# Patient Record
Sex: Female | Born: 1965 | Race: White | Hispanic: No | Marital: Married | Smoking: Never smoker
Health system: Southern US, Community
[De-identification: ages and names within clinical notes are randomized; demographics above are authoritative.]

## PROBLEM LIST (undated history)

## (undated) DIAGNOSIS — I10 Essential (primary) hypertension: Secondary | ICD-10-CM

## (undated) DIAGNOSIS — E119 Type 2 diabetes mellitus without complications: Secondary | ICD-10-CM

## (undated) HISTORY — PX: BREAST BIOPSY: SHX20

## (undated) HISTORY — DX: Essential (primary) hypertension: I10

## (undated) HISTORY — PX: CHOLECYSTECTOMY: SHX55

## (undated) HISTORY — DX: Type 2 diabetes mellitus without complications: E11.9

---

## 2013-04-12 ENCOUNTER — Ambulatory Visit: Payer: Self-pay | Admitting: Obstetrics and Gynecology

## 2013-04-19 ENCOUNTER — Ambulatory Visit: Payer: Self-pay | Admitting: Obstetrics and Gynecology

## 2014-02-08 ENCOUNTER — Ambulatory Visit: Payer: Self-pay | Admitting: Obstetrics and Gynecology

## 2014-02-08 DIAGNOSIS — I10 Essential (primary) hypertension: Secondary | ICD-10-CM

## 2014-02-08 LAB — CBC
HCT: 38.2 % (ref 35.0–47.0)
HGB: 12.8 g/dL (ref 12.0–16.0)
MCH: 29.3 pg (ref 26.0–34.0)
MCHC: 33.6 g/dL (ref 32.0–36.0)
MCV: 87 fL (ref 80–100)
Platelet: 379 10*3/uL (ref 150–440)
RBC: 4.37 10*6/uL (ref 3.80–5.20)
RDW: 13.8 % (ref 11.5–14.5)
WBC: 8.5 10*3/uL (ref 3.6–11.0)

## 2014-02-13 ENCOUNTER — Ambulatory Visit: Payer: Self-pay | Admitting: Obstetrics and Gynecology

## 2014-02-14 LAB — PATHOLOGY REPORT

## 2014-04-21 ENCOUNTER — Ambulatory Visit: Payer: Self-pay | Admitting: Obstetrics and Gynecology

## 2014-12-09 NOTE — Op Note (Signed)
PATIENT NAME:  Diana Moran, Diana Moran MR#:  [id number] DATE OF BIRTH:  1966/03/03  DATE OF PROCEDURE:  02/13/2014  PREOPERATIVE DIAGNOSIS: Menorrhagia.   POSTOPERATIVE DIAGNOSES:  1. Menorrhagia.  2. Uterine prolapse.  3. Traction cystocele.   OPERATIVE PROCEDURES:  1. Hysteroscopy with dilation and curettage of endometrium.  2. NovaSure endometrial ablation.   ANESTHESIA: General LMA.   SURGEON: Prentice DockerMartin A. Kamri Gotsch, MD   FIRST ASSISTANT: Jimmey RalphJade Morse, PA-S  INDICATIONS: The patient is a 49 year old married white female with a long history of menorrhagia refractory to conservative medical and surgical therapy. She is desiring definitive surgery through NovaSure endometrial ablation.   FINDINGS AT SURGERY: Revealed a gynecoid bony pelvis. There was uterine prolapse to within 2 cm of the introitus. There was a 2nd-degree traction cystocele present. The uterus sounded to 9 cm. Cervical length was 4 cm. The cavity width was 4 cm. Hysteroscopy post ablation demonstrated an excellent thermal effect in 90% of the uterine cavity with minimal effect at the uterine fundus toward the left cornea.   DESCRIPTION OF PROCEDURE: The patient was brought to the operating room where she was placed in the supine position. General anesthesia with LMA was induced without difficulty. She was placed in the dorsal lithotomy position using candy-cane stirrups. A Betadine perineal, intravaginal preparation Sterile drape was performed in standard fashion. A red Robinson catheter was used to drain 50 mL of urine from the bladder. A weighted speculum was placed into the vagina and a single-tooth tenaculum was placed on the anterior lip of the cervix. The uterus was sounded to 9 cm. The cervical canal was dilated with Hanks dilators up to a #8 JamaicaFrench. The [facility] hysteroscope using lactated Ringer's as irrigant was used to assess the intrauterine cavity. There was a proliferative-type endometrium present and the uterine ostia  bilaterally were visualized. Following curettage with both smooth and serrated curettes, the NovaSure instrument was inserted in standard fashion and deployed. Cavity test was performed and successful. The endometrial ablation was then performed in 69 seconds. Following completion of the procedure, the instrument was removed. A repeat hysteroscopy demonstrated an excellent thermal effect. The procedure was then terminated with all instrumentation being removed from the vagina. The patient was then awakened, mobilized, and taken to the recovery room in satisfactory condition.   ESTIMATED BLOOD LOSS: Minimal.   IV FLUIDS: 500 mL.   URINE OUTPUT: 50 mL.  COMPLICATIONS: None.   ADDENDUM NOTE: Please note that this patient is an excellent TVH candidate if hysterectomy is needed in the future.    ____________________________ Prentice DockerMartin A. Floetta Brickey, MD mad:dd D: 02/15/2014 16:44:16 ET Moran: 02/16/2014 04:58:30 ET JOB#: [id number]  cc: Daphine DeutscherMartin A. Daren Yeagle, MD, <Dictator> [facility] Prentice DockerMARTIN A Adanna Zuckerman MD ELECTRONICALLY SIGNED 02/18/2014 3:56

## 2017-01-27 DIAGNOSIS — E782 Mixed hyperlipidemia: Secondary | ICD-10-CM | POA: Insufficient documentation

## 2017-01-30 DIAGNOSIS — E119 Type 2 diabetes mellitus without complications: Secondary | ICD-10-CM | POA: Insufficient documentation

## 2017-02-16 ENCOUNTER — Encounter: Payer: Self-pay | Admitting: *Deleted

## 2017-02-16 ENCOUNTER — Encounter: Payer: Managed Care, Other (non HMO) | Attending: Internal Medicine | Admitting: *Deleted

## 2017-02-16 VITALS — BP 122/70 | Ht 64.0 in | Wt 214.3 lb

## 2017-02-16 DIAGNOSIS — E119 Type 2 diabetes mellitus without complications: Secondary | ICD-10-CM | POA: Insufficient documentation

## 2017-02-16 DIAGNOSIS — Z713 Dietary counseling and surveillance: Secondary | ICD-10-CM | POA: Diagnosis not present

## 2017-02-16 NOTE — Progress Notes (Signed)
Diabetes Self-Management Education  Visit Type: First/Initial  Appt. Start Time: 1355 Appt. End Time: 1500  02/16/2017  [name], identified by name and date of birth, is a 51 y.o. female with a diagnosis of Diabetes: Type 2.   ASSESSMENT  Blood pressure 122/70, height 5\' 4"  (1.626 m), weight 214 lb 4.8 oz (97.2 kg). Body mass index is 36.78 kg/m.      Diabetes Self-Management Education - 02/16/17 1508      Visit Information   Visit Type First/Initial     Initial Visit   Diabetes Type Type 2   Are you currently following a meal plan? Yes   What type of meal plan do you follow? weight watchers   Are you taking your medications as prescribed? Yes   Date Diagnosed June 2018     Health Coping   How would you rate your overall health? Good     Psychosocial Assessment   Patient Belief/Attitude about Diabetes Motivated to manage diabetes   Self-care barriers None   Self-management support Doctor's office   Patient Concerns Nutrition/Meal planning;Medication;Weight Control;Glycemic Control;Problem Solving;Healthy Lifestyle;Monitoring   Special Needs None   Preferred Learning Style Visual   Learning Readiness Change in progress   How often do you need to have someone help you when you read instructions, pamphlets, or other written materials from your doctor or pharmacy? 1 - Never   What is the last grade level you completed in school? 1 year college     Pre-Education Assessment   Patient understands the diabetes disease and treatment process. Needs Instruction   Patient understands incorporating nutritional management into lifestyle. Needs Instruction   Patient undertands incorporating physical activity into lifestyle. Needs Review   Patient understands using medications safely. Needs Instruction   Patient understands monitoring blood glucose, interpreting and using results Needs Review   Patient understands prevention, detection, and treatment of acute complications.  Needs Instruction   Patient understands prevention, detection, and treatment of chronic complications. Needs Instruction   Patient understands how to develop strategies to address psychosocial issues. Needs Instruction   Patient understands how to develop strategies to promote health/change behavior. Needs Instruction     Complications   How often do you check your blood sugar? 1-2 times/day   Fasting Blood glucose range (mg/dL) 409-811;914-782  FBG's 189-199 mg/dL with reading of 956 mg/dL today.   Postprandial Blood glucose range (mg/dL) 213-086  2 hrs after supper 190's mg/dL   Have you had a dilated eye exam in the past 12 months? No   Have you had a dental exam in the past 12 months? No   Are you checking your feet? Yes   How many days per week are you checking your feet? 7     Dietary Intake   Breakfast bacon or ham, egg, 1/2 cup grits   Lunch sandwich with 40 cal bread, salad, fruit   Snack (afternoon) fruit   Dinner salad, chicken, shrimp, black beans, fruit   Beverage(s) wtaer, unsweetened tea, 1 diet soda per day     Exercise   Exercise Type Light (walking / raking leaves)   How many days per week to you exercise? 4   How many minutes per day do you exercise? 30   Total minutes per week of exercise 120     Patient Education   Previous Diabetes Education No   Disease state  Definition of diabetes, type 1 and 2, and the diagnosis of diabetes;Explored patient's options for treatment  of their diabetes   Nutrition management  Role of diet in the treatment of diabetes and the relationship between the three main macronutrients and blood glucose level;Reviewed blood glucose goals for pre and post meals and how to evaluate the patients' food intake on their blood glucose level.;Meal timing in regards to the patients' current diabetes medication.   Physical activity and exercise  Role of exercise on diabetes management, blood pressure control and cardiac health.   Medications  Reviewed patients medication for diabetes, action, purpose, timing of dose and side effects.   Monitoring Purpose and frequency of SMBG.;Taught/discussed recording of test results and interpretation of SMBG.;Identified appropriate SMBG and/or A1C goals.   Chronic complications Relationship between chronic complications and blood glucose control   Psychosocial adjustment Identified and addressed patients feelings and concerns about diabetes     Individualized Goals (developed by patient)   Reducing Risk Improve blood sugars Decrease medications Prevent diabetes complications Lose weight Lead a healthier lifestyle Become more fit     Outcomes   Expected Outcomes Demonstrated interest in learning. Expect positive outcomes      Individualized Plan for Diabetes Self-Management Training:   Learning Objective:  Patient will have a greater understanding of diabetes self-management. Patient education plan is to attend individual and/or group sessions per assessed needs and concerns.   Plan:   Patient Instructions  Check blood sugars 2 x day before breakfast and 2 hrs after supper every day Bring blood sugar records to the next class Exercise: Continue walking  for 30-45  minutes 3-4  days a week Eat 3 meals day,1-2 snacks a day Space meals 4-6 hours apart Make an eye doctor appointment  Expected Outcomes:  Demonstrated interest in learning. Expect positive outcomes  Education material provided:  General Meal Planning Guidelines Simple Meal Plan  If problems or questions, patient to contact team via:  Sharion SettlerSheila Amalia Edgecombe, RN, CCM, CDE [phone]  Future DSME appointment:  Patient to check her work calendar and call back to schedule classes.

## 2017-02-16 NOTE — Patient Instructions (Signed)
Check blood sugars 2 x day before breakfast and 2 hrs after supper every day Bring blood sugar records to the next class  Exercise: Continue walking  for 30-45  minutes 3-4  days a week  Eat 3 meals day,1-2 snacks a day Space meals 4-6 hours apart  Make an eye doctor appointment  Return for classes on:

## 2017-03-17 ENCOUNTER — Encounter: Payer: Self-pay | Admitting: *Deleted

## 2018-03-25 ENCOUNTER — Ambulatory Visit: Payer: Self-pay | Admitting: Emergency Medicine

## 2018-03-25 VITALS — BP 134/80 | HR 81 | Temp 97.9°F | Resp 18

## 2018-03-25 DIAGNOSIS — W57XXXA Bitten or stung by nonvenomous insect and other nonvenomous arthropods, initial encounter: Secondary | ICD-10-CM

## 2018-03-25 DIAGNOSIS — L239 Allergic contact dermatitis, unspecified cause: Secondary | ICD-10-CM

## 2018-03-25 DIAGNOSIS — I10 Essential (primary) hypertension: Secondary | ICD-10-CM | POA: Insufficient documentation

## 2018-03-25 MED ORDER — BETAMETHASONE DIPROPIONATE 0.05 % EX CREA
TOPICAL_CREAM | CUTANEOUS | 0 refills | Status: DC
Start: 2018-03-25 — End: 2018-05-27

## 2018-03-25 MED ORDER — HYDROXYZINE HCL 25 MG PO TABS: ORAL_TABLET | ORAL | 0 refills | Status: DC

## 2018-03-25 NOTE — Progress Notes (Signed)
Subjective; She reports she stepped in an ants nest days 4 ago, since then progressively worsening itch and redness and bumps left foot.  Denies pain.  Denies any other rash . No red streaks. The rash is not progressing up her leg. Denies any generalized symptoms. No problems swallowing or breathing. No chest pain or shortness of breath or abdominal or GI symptoms. No fever or chills or nausea or vomiting. Tried topical Benadryl which helped a little. Tried oral Benadryl last night, and that was not that effective in helping the severe itch last night. Denies chance of pregnancy  Objective: Pleasant female, no distress.  Blood pressure 134/80 pulse 81, temp 97.9 respirations 18, oxygen saturation 100% on room air. HEENT: Normal.  No facial swelling.  Oropharynx clear Neck: Supple Heart: Regular rate and rhythm Lungs: Equal expansion Left foot: Multiple red papules.  No exudate or drainage or bleeding or fluctuance.  There is surrounding erythema, but no red streaks.  DP pulse and neurovascular distally intact. Skin: Above rash left foot.  No other rash noted.  Assessment: Localized allergic reaction from insect bites, left foot.  No evidence of systemic allergic reaction.  No evidence of localized infection  Plans: Treatment options discussed, as well as risks, benefits, alternatives. Patient voiced understanding and agreement with the following plans: Hydroxyzine 25 mg, 1 or 2 at bedtime as needed severe itch. Betamethasone dipropionate cream, apply left foot twice daily. Other symptomatic care discussed. Follow-up with your primary care doctor in 5-7 days if not improving, or sooner if symptoms become worse. Precautions discussed. Red flags discussed. Questions invited and answered.-Patient was offered AVS, but she declined. Patient voiced understanding and agreement.

## 2018-05-27 ENCOUNTER — Ambulatory Visit: Payer: Self-pay | Admitting: Emergency Medicine

## 2018-05-27 VITALS — BP 118/85 | HR 71 | Temp 98.2°F | Resp 14 | Ht 64.5 in | Wt 210.0 lb

## 2018-05-27 DIAGNOSIS — R05 Cough: Secondary | ICD-10-CM

## 2018-05-27 DIAGNOSIS — J0100 Acute maxillary sinusitis, unspecified: Secondary | ICD-10-CM

## 2018-05-27 DIAGNOSIS — R059 Cough, unspecified: Secondary | ICD-10-CM

## 2018-05-27 MED ORDER — BENZONATATE 100 MG PO CAPS: 100.0000 mg | ORAL_CAPSULE | Freq: Three times a day (TID) | ORAL | 0 refills | Status: DC | PRN

## 2018-05-27 MED ORDER — AMOXICILLIN 875 MG PO TABS: 875.0000 mg | ORAL_TABLET | Freq: Two times a day (BID) | ORAL | 0 refills | Status: DC

## 2018-05-27 MED ORDER — FLUCONAZOLE 150 MG PO TABS: 150.0000 mg | ORAL_TABLET | Freq: Once | ORAL | 0 refills | Status: AC

## 2018-05-27 NOTE — Progress Notes (Signed)
Subjective. Patient had onset of symptoms 1 week ago.  She started with a sore throat and this was followed by development of laryngitis which lasted approximately 48 hours.  She has had head and chest congestion but primarily nasal congestion.  She occasionally has a productive cough and intermittently has greenish thick discharge from her nose.  Of note patient is diabetic. Objective. Alert and cooperative not in any distress. TMs clear. Throat normal. Chest clear to auscultation percussion. Puffiness noted over both maxillary sinuses with minimal tenderness. Assessment. Acute maxillary sinusitis Cough.. Plan. Amoxicillin 875 twice daily. Tessalon Perles. Diflucan as prophylaxis. Patient advised not to take simvastatin while taking the Diflucan.  If she takes a Diflucan she will hold the simvastatin for 3 days

## 2018-05-27 NOTE — Patient Instructions (Addendum)
Take amoxicillin twice a day. Use Tessalon Perles as needed. If you take the Diflucan do not take your simvastatin for the following 3 days. Return to clinic if worsening symptoms. Sinusitis, Adult Sinusitis is soreness and inflammation of your sinuses. Sinuses are hollow spaces in the bones around your face. They are located:  Around your eyes.  In the middle of your forehead.  Behind your nose.  In your cheekbones.  Your sinuses and nasal passages are lined with a stringy fluid (mucus). Mucus normally drains out of your sinuses. When your nasal tissues get inflamed or swollen, the mucus can get trapped or blocked so air cannot flow through your sinuses. This lets bacteria, viruses, and funguses grow, and that leads to infection. Follow these instructions at home: Medicines  Take, use, or apply over-the-counter and prescription medicines only as told by your doctor. These may include nasal sprays.  If you were prescribed an antibiotic medicine, take it as told by your doctor. Do not stop taking the antibiotic even if you start to feel better. Hydrate and Humidify  Drink enough water to keep your pee (urine) clear or pale yellow.  Use a cool mist humidifier to keep the humidity level in your home above 50%.  Breathe in steam for 10-15 minutes, 3-4 times a day or as told by your doctor. You can do this in the bathroom while a hot shower is running.  Try not to spend time in cool or dry air. Rest  Rest as much as possible.  Sleep with your head raised (elevated).  Make sure to get enough sleep each night. General instructions  Put a warm, moist washcloth on your face 3-4 times a day or as told by your doctor. This will help with discomfort.  Wash your hands often with soap and water. If there is no soap and water, use hand sanitizer.  Do not smoke. Avoid being around people who are smoking (secondhand smoke).  Keep all follow-up visits as told by your doctor. This is  important. Contact a doctor if:  You have a fever.  Your symptoms get worse.  Your symptoms do not get better within 10 days. Get help right away if:  You have a very bad headache.  You cannot stop throwing up (vomiting).  You have pain or swelling around your face or eyes.  You have trouble seeing.  You feel confused.  Your neck is stiff.  You have trouble breathing. This information is not intended to replace advice given to you by your health care provider. Make sure you discuss any questions you have with your health care provider. Document Released: 01/21/2008 Document Revised: 03/30/2016 Document Reviewed: 05/30/2015 Elsevier Interactive Patient Education  [employer].

## 2020-01-30 ENCOUNTER — Telehealth: Payer: Managed Care, Other (non HMO) | Admitting: Nurse Practitioner

## 2020-01-30 ENCOUNTER — Other Ambulatory Visit: Payer: Self-pay

## 2020-01-30 ENCOUNTER — Encounter: Payer: Self-pay | Admitting: Nurse Practitioner

## 2020-01-30 DIAGNOSIS — J069 Acute upper respiratory infection, unspecified: Secondary | ICD-10-CM

## 2020-01-30 NOTE — Patient Instructions (Addendum)
Diana Moran continue to hydrate yourself Consider hot tea, with honey and lemon for your cough Continue the Loratidine and cough syrup as directed Please start Fluticasone (Flonase) and take 1 spray to each nostril twice daily until symptoms resolve If your symptoms are worsening or are not improving by 02/03/2020 please call the office and have them send me a message via in basket and I will send you in an antibiotic. At this time, this is viral and hopefull will resolve with supporative therapy. Patient verbalized understanding of all instructions given/reviewed and has no further questions or concerns at this time.

## 2020-01-30 NOTE — Progress Notes (Signed)
   Subjective:    Patient ID: Diana Moran, female    DOB: 05/11/1966, 54 y.o.   MRN: [id number]  HPI Diana Moran is on a virtual visit today with permission to treat. She reports sinus pressure and cough x 4 days. She reports a scratchy throat but denies a sore throat. She endorses some sneezing and runny nose; which is clear in nature. She also has periods of nasal congestion. She started Loratadine yesterday and has taken 4 doses of Robitussin DM with some relief. She denies fever, fatigue, wheezing, SOB, facial pain and ear pain.     Review of Systems  Constitutional: Negative for fatigue and fever.  HENT: Positive for congestion, postnasal drip and sneezing. Negative for ear pain, sinus pain and sore throat.   Respiratory: Positive for cough.        Objective:   Physical Exam Constitutional:      General: She is not in acute distress.    Appearance: Normal appearance. She is not ill-appearing, toxic-appearing or diaphoretic.  HENT:     Head: Normocephalic and atraumatic.  Pulmonary:     Effort: Pulmonary effort is normal.  Neurological:     Mental Status: She is alert and oriented to person, place, and time.  Psychiatric:        Mood and Affect: Mood normal.        Behavior: Behavior normal.           Assessment & Plan:

## 2020-02-02 ENCOUNTER — Other Ambulatory Visit: Payer: Self-pay

## 2020-02-02 ENCOUNTER — Encounter: Payer: Self-pay | Admitting: Medical

## 2020-02-02 ENCOUNTER — Ambulatory Visit: Payer: Managed Care, Other (non HMO) | Admitting: Medical

## 2020-02-02 DIAGNOSIS — R05 Cough: Secondary | ICD-10-CM | POA: Diagnosis not present

## 2020-02-02 DIAGNOSIS — J069 Acute upper respiratory infection, unspecified: Secondary | ICD-10-CM

## 2020-02-02 DIAGNOSIS — R059 Cough, unspecified: Secondary | ICD-10-CM

## 2020-02-02 MED ORDER — BENZONATATE 100 MG PO CAPS: ORAL_CAPSULE | ORAL | 0 refills | Status: DC

## 2020-02-02 NOTE — Patient Instructions (Signed)
Upper Respiratory Infection, Adult An upper respiratory infection (URI) affects the nose, throat, and upper air passages. URIs are caused by germs (viruses). The most common type of URI is often called "the common cold." Medicines cannot cure URIs, but you can do things at home to relieve your symptoms. URIs usually get better within 7-10 days. Follow these instructions at home: Activity  Rest as needed.  If you have a fever, stay home from work or school until your fever is gone, or until your doctor says you may return to work or school. ? You should stay home until you cannot spread the infection anymore (you are not contagious). ? Your doctor may have you wear a face mask so you have less risk of spreading the infection. Relieving symptoms  Gargle with a salt-water mixture 3-4 times a day or as needed. To make a salt-water mixture, completely dissolve -1 tsp of salt in 1 cup of warm water.  Use a cool-mist humidifier to add moisture to the air. This can help you breathe more easily. Eating and drinking   Drink enough fluid to keep your pee (urine) pale yellow.  Eat soups and other clear broths. General instructions   Take over-the-counter and prescription medicines only as told by your doctor. These include cold medicines, fever reducers, and cough suppressants.  Do not use any products that contain nicotine or tobacco. These include cigarettes and e-cigarettes. If you need help quitting, ask your doctor.  Avoid being where people are smoking (avoid secondhand smoke).  Make sure you get regular shots and get the flu shot every year.  Keep all follow-up visits as told by your doctor. This is important. How to avoid spreading infection to others   Wash your hands often with soap and water. If you do not have soap and water, use hand sanitizer.  Avoid touching your mouth, face, eyes, or nose.  Cough or sneeze into a tissue or your sleeve or elbow. Do not cough or sneeze  into your hand or into the air. Contact a doctor if:  You are getting worse, not better.  You have any of these: ? A fever. ? Chills. ? Brown or red mucus in your nose. ? Yellow or brown fluid (discharge)coming from your nose. ? Pain in your face, especially when you bend forward. ? Swollen neck glands. ? Pain with swallowing. ? White areas in the back of your throat. Get help right away if:  You have shortness of breath that gets worse.  You have very bad or constant: ? Headache. ? Ear pain. ? Pain in your forehead, behind your eyes, and over your cheekbones (sinus pain). ? Chest pain.  You have long-lasting (chronic) lung disease along with any of these: ? Wheezing. ? Long-lasting cough. ? Coughing up blood. ? A change in your usual mucus.  You have a stiff neck.  You have changes in your: ? Vision. ? Hearing. ? Thinking. ? Mood. Summary  An upper respiratory infection (URI) is caused by a germ called a virus. The most common type of URI is often called "the common cold."  URIs usually get better within 7-10 days.  Take over-the-counter and prescription medicines only as told by your doctor. This information is not intended to replace advice given to you by your health care provider. Make sure you discuss any questions you have with your health care provider. Document Revised: 08/12/2018 Document Reviewed: 03/27/2017 Elsevier Patient Education  2020 [employer] Cough, Adult A cough  helps to clear your throat and lungs. A cough may be a sign of an illness or another medical condition. An acute cough may only last 2-3 weeks, while a chronic cough may last 8 or more weeks. Many things can cause a cough. They include:  Germs (viruses or bacteria) that attack the airway.  Breathing in things that bother (irritate) your lungs.  Allergies.  Asthma.  Mucus that runs down the back of your throat (postnasal drip).  Smoking.  Acid backing up from the stomach  into the tube that moves food from the mouth to the stomach (gastroesophageal reflux).  Some medicines.  Lung problems.  Other medical conditions, such as heart failure or a blood clot in the lung (pulmonary embolism). Follow these instructions at home: Medicines  Take over-the-counter and prescription medicines only as told by your doctor.  Talk with your doctor before you take medicines that stop a cough (coughsuppressants). Lifestyle   Do not smoke, and try not to be around smoke. Do not use any products that contain nicotine or tobacco, such as cigarettes, e-cigarettes, and chewing tobacco. If you need help quitting, ask your doctor.  Drink enough fluid to keep your pee (urine) pale yellow.  Avoid caffeine.  Do not drink alcohol if your doctor tells you not to drink. General instructions   Watch for any changes in your cough. Tell your doctor about them.  Always cover your mouth when you cough.  Stay away from things that make you cough, such as perfume, candles, campfire smoke, or cleaning products.  If the air is dry, use a cool mist vaporizer or humidifier in your home.  If your cough is worse at night, try using extra pillows to raise your head up higher while you sleep.  Rest as needed.  Keep all follow-up visits as told by your doctor. This is important. Contact a doctor if:  You have new symptoms.  You cough up pus.  Your cough does not get better after 2-3 weeks, or your cough gets worse.  Cough medicine does not help your cough and you are not sleeping well.  You have pain that gets worse or pain that is not helped with medicine.  You have a fever.  You are losing weight and you do not know why.  You have night sweats. Get help right away if:  You cough up blood.  You have trouble breathing.  Your heartbeat is very fast. These symptoms may be an emergency. Do not wait to see if the symptoms will go away. Get medical help right away. Call  your local emergency services (911 in the [place] Do not drive yourself to the hospital. Summary  A cough helps to clear your throat and lungs. Many things can cause a cough.  Take over-the-counter and prescription medicines only as told by your doctor.  Always cover your mouth when you cough.  Contact a doctor if you have new symptoms or you have a cough that does not get better or gets worse. This information is not intended to replace advice given to you by your health care provider. Make sure you discuss any questions you have with your health care provider. Document Revised: 08/23/2018 Document Reviewed: 08/23/2018 Elsevier Patient Education  2020 [employer] Inc[employer]  

## 2020-02-02 NOTE — Progress Notes (Signed)
° °  Subjective:    Patient ID: Diana Moran, female    DOB: May 27, 1966, 54 y.o.   MRN: [id number]  HPI  This a telemedicine appointment for complaint of cough worse at night since her visit on 01/30/2020.Reviewed note from 01/30/2020. Nurse unable to add it as a telephone virtual appointment on clinics schedule.  Contacted original provider as guided by providers note. Response back was for me to access and take care of the patient.  Called patient at  2:40pm, no answer. Left message with return phone call number.   Called at  3:16pm left message and number for return call.  Patient returned my call at  3:48 pm. 54 yo female who does not sound to be in any distress. She states she had trouble sleeping due to cough last night, she did try some Robitussin -DM but it did not help. Cough is better during the day.  It is productive slightly and clear. She denies any fever or chills, fatigue, SOB, wheezing or CP, or abdominal pain. She has a history of diarrhea with taking Metformin, so no new changes.. Nasal congestion is improve to where she can now breath in and out of her nose.  She has tried Social worker. Taking Claritin and Flonase as instructed.  She has not had Covid-19 but has had the Covid-19 vaccine with the second vaccine on Feb. 2nd 2021.     Review of Systems  Constitutional: Negative for chills, fatigue and fever.  HENT: Positive for congestion (much better today).   Respiratory: Positive for cough (slight productive cough- clear). Negative for shortness of breath and wheezing.   Cardiovascular: Negative for chest pain.  Gastrointestinal: Positive for diarrhea (her baseline is with diarrhea due to her Metformin.). Negative for abdominal pain, nausea and vomiting.  Musculoskeletal: Negative for arthralgias and myalgias.   Allergy to meds: none  Medications currently taking: Metformin XR\Trulicity .75mg  Lisnopril-HCTZ20/12.5mg     Objective: This was a telephone appointment    Physical Exam Pulmonary:     Breath sounds: No wheezing (none heard over phone).  Neurological:     Mental Status: She is alert and oriented to person, place, and time.  Psychiatric:        Mood and Affect: Mood normal.        Thought Content: Thought content normal.        Judgment: Judgment normal.   No cough heard during the phone call. Able to speak in full sentences.        Assessment & Plan:  URI- viral with at night cough.Nasal Congestion improving. History of Diabletes. Will try Benzonatate 100 mg, may take two if needed.  Sent to patient pharmacy. Meds ordered this encounter  Medications   benzonatate (TESSALON) 100 MG capsule    Sig: Take one -two capsules by mouth every 8 hours as needed for cough.    Dispense:  20 capsule    Refill:  0    If medication is not  helping she may call tomorrow for reevaluation. Patient verbalizes understanding and has no questions at the end of our conversation. May consider taking Benadryl 25mg  at bedtime to help with possible PND. May consider Covid-19 testing if she is unable to sleep tonight or if worsening. May also consider adding an antibiotic to cover for secondary infection like Z-Pak.. As of 02/03/2020 she will have been sick x 8 days.

## 2020-06-07 ENCOUNTER — Other Ambulatory Visit: Payer: Self-pay

## 2020-06-07 ENCOUNTER — Encounter: Payer: Self-pay | Admitting: *Deleted

## 2020-06-07 ENCOUNTER — Encounter: Payer: Managed Care, Other (non HMO) | Attending: Internal Medicine | Admitting: *Deleted

## 2020-06-07 VITALS — BP 110/76 | Ht 64.5 in | Wt 220.0 lb

## 2020-06-07 DIAGNOSIS — E1165 Type 2 diabetes mellitus with hyperglycemia: Secondary | ICD-10-CM

## 2020-06-07 DIAGNOSIS — E119 Type 2 diabetes mellitus without complications: Secondary | ICD-10-CM | POA: Insufficient documentation

## 2020-06-07 NOTE — Progress Notes (Signed)
Diabetes Self-Management Education  Visit Type: First/Initial  Appt. Start Time: 1535 Appt. End Time: 1645  06/07/2020  [name], identified by name and date of birth, is a 54 y.o. female with a diagnosis of Diabetes: Type 2.   ASSESSMENT  Blood pressure 110/76, height 5' 4.5" (1.638 m), weight 220 lb (99.8 kg). Body mass index is 37.18 kg/m.   Diabetes Self-Management Education - 06/07/20 1653      Visit Information   Visit Type First/Initial      Initial Visit   Diabetes Type Type 2    Are you currently following a meal plan? No    Are you taking your medications as prescribed? Yes    Date Diagnosed 3 years ago      Health Coping   How would you rate your overall health? Good      Psychosocial Assessment   Patient Belief/Attitude about Diabetes Motivated to manage diabetes   "stress about numbers and frustrated with diet"   Self-care barriers None    Self-management support Doctor's office;Friends;Family    Patient Concerns Nutrition/Meal planning;Medication;Monitoring;Glycemic Control;Weight Control;Healthy Lifestyle    Special Needs None    Preferred Learning Style Auditory;Visual;Hands on    Learning Readiness Change in progress    How often do you need to have someone help you when you read instructions, pamphlets, or other written materials from your doctor or pharmacy? 1 - Never    What is the last grade level you completed in school? 12th grade      Pre-Education Assessment   Patient understands the diabetes disease and treatment process. Needs Review    Patient understands incorporating nutritional management into lifestyle. Needs Review    Patient undertands incorporating physical activity into lifestyle. Needs Review    Patient understands using medications safely. Needs Instruction    Patient understands monitoring blood glucose, interpreting and using results Needs Review    Patient understands prevention, detection, and treatment of acute  complications. Needs Instruction    Patient understands prevention, detection, and treatment of chronic complications. Needs Review    Patient understands how to develop strategies to address psychosocial issues. Needs Instruction    Patient understands how to develop strategies to promote health/change behavior. Needs Instruction      Complications   Last HgB A1C per patient/outside source 7.9 %   05/04/2020   How often do you check your blood sugar? 1-2 times/day    Fasting Blood glucose range (mg/dL) 841-660;>630   Pt reports FBG's 180-220 mg/dL.   Have you had a dilated eye exam in the past 12 months? No    Have you had a dental exam in the past 12 months? Yes    Are you checking your feet? Yes    How many days per week are you checking your feet? 7      Dietary Intake   Breakfast 1-2 eggs, gluten free grits; eggs and bacon; smoothie with almond milk, [place] yogurt, frozen fruit - strawberries, peaches, pineapple    Snack (morning) 0-2 snacks/day wheat thins, smart popcorn    Lunch salad (lettuce, cuccumbers, onions, cheese with ranch dressiong and sometimes chicken, veggie plate - potaotes, lima beans, peas    Dinner chicken, pork, fried fish weekly with slaw, occasional beef; potaotes, beans, peas, corn, whole wheat pasta, broccoli, green beans, carrots, squash, zucchini    Snack (evening) sometimes carb free ice cream    Beverage(s) water, unsweetened tea, coffee, diet soda      Exercise  Exercise Type Light (walking / raking leaves)    How many days per week to you exercise? 4    How many minutes per day do you exercise? 40    Total minutes per week of exercise 160      Patient Education   Previous Diabetes Education Yes (please comment)   2018 - she came for 1 initial visit   Disease state  Definition of diabetes, type 1 and 2, and the diagnosis of diabetes;Factors that contribute to the development of diabetes    Nutrition management  Role of diet in the treatment of diabetes  and the relationship between the three main macronutrients and blood glucose level;Food label reading, portion sizes and measuring food.;Carbohydrate counting;Reviewed blood glucose goals for pre and post meals and how to evaluate the patients' food intake on their blood glucose level.    Physical activity and exercise  Role of exercise on diabetes management, blood pressure control and cardiac health.    Medications Reviewed patients medication for diabetes, action, purpose, timing of dose and side effects.    Monitoring Purpose and frequency of SMBG.;Taught/discussed recording of test results and interpretation of SMBG.;Identified appropriate SMBG and/or A1C goals.    Chronic complications Relationship between chronic complications and blood glucose control    Psychosocial adjustment Role of stress on diabetes;Identified and addressed patients feelings and concerns about diabetes      Individualized Goals (developed by patient)   Reducing Risk Other (comment)   improve blood sugars, decrease medications, prevent diabetes complications, lose weight, lead a healthier llifestyle, become more fit     Outcomes   Expected Outcomes Demonstrated interest in learning. Expect positive outcomes    Future DMSE 2 wks           Individualized Plan for Diabetes Self-Management Training:   Learning Objective:  Patient will have a greater understanding of diabetes self-management. Patient education plan is to attend individual and/or group sessions per assessed needs and concerns.   Plan:   Patient Instructions  Check blood sugars 1 x day before breakfast or  2 hrs after one meal every day Bring blood sugar records to the next class Exercise: Continue walking for 30-45 minutes 3-4 days a week Eat 3 meals day,1-2 snacks a day Space meals 4-6 hours apart Limit fried foods Make an eye doctor appointment  Expected Outcomes:  Demonstrated interest in learning. Expect positive outcomes  Education  material provided:  General Meal Planning Guidelines Simple Meal Plan  If problems or questions, patient to contact team via:  Sharion Settler, RN, CCM, CDCES [phone]  Future DSME appointment: 2 wks  June 21, 2020 for Diabetes Class 1

## 2020-06-07 NOTE — Patient Instructions (Addendum)
Check blood sugars 1 x day before breakfast or  2 hrs after one meal every day Bring blood sugar records to the next class  Exercise: Continue walking for 30-45 minutes 3-4 days a week  Eat 3 meals day,1-2 snacks a day Space meals 4-6 hours apart Limit fried foods  Make an eye doctor appointment  Return for classes on:

## 2020-06-21 ENCOUNTER — Ambulatory Visit: Payer: Managed Care, Other (non HMO)

## 2020-06-28 ENCOUNTER — Ambulatory Visit: Payer: Managed Care, Other (non HMO)

## 2020-07-03 ENCOUNTER — Encounter: Payer: Self-pay | Admitting: *Deleted

## 2020-07-16 ENCOUNTER — Other Ambulatory Visit: Payer: Self-pay

## 2020-07-16 ENCOUNTER — Telehealth: Payer: Managed Care, Other (non HMO) | Admitting: Nurse Practitioner

## 2020-07-16 DIAGNOSIS — R0981 Nasal congestion: Secondary | ICD-10-CM

## 2020-07-16 DIAGNOSIS — H9201 Otalgia, right ear: Secondary | ICD-10-CM

## 2020-07-16 MED ORDER — BENZONATATE 200 MG PO CAPS: 200.0000 mg | ORAL_CAPSULE | Freq: Two times a day (BID) | ORAL | 0 refills | Status: AC | PRN

## 2020-07-16 MED ORDER — AMOXICILLIN 500 MG PO CAPS: 500.0000 mg | ORAL_CAPSULE | Freq: Three times a day (TID) | ORAL | 0 refills | Status: AC

## 2020-07-16 NOTE — Progress Notes (Signed)
   Subjective:    Patient ID: Diana Moran, female    DOB: [birth year], 54 y.o.   MRN: [id number]  HPI  I connected with  Diana Moran on 07/16/20 by a video enabled telemedicine application and verified that I am speaking with the correct person using two identifiers.   I discussed the limitations of evaluation and management by telemedicine. The patient expressed understanding and agreed to proceed.  Diana Moran is a 54 y.o. female with c/o sinus congestion and right ear pain. The symptoms started 5 days ago and include pressure around the right eye that radiates to the right ear and right upper teeth. Home tx includes Sinus max mucinex and use of humidifier. Patient reports coughing due to post nasal drainage.   Past Medical History:  Diagnosis Date  . Diabetes mellitus without complication (HCC)   . Hypertension    Past Surgical History:  Procedure Laterality Date  . CHOLECYSTECTOMY     Current Outpatient Medications on File Prior to Visit  Medication Sig Dispense Refill  . glucose blood (CONTOUR NEXT TEST) test strip USE DAILY TO CHECK BLOOD SUGAR    . lisinopril-hydrochlorothiazide (ZESTORETIC) 20-12.5 MG tablet Take 1 tablet by mouth daily.    . metFORMIN (GLUCOPHAGE-XR) 500 MG 24 hr tablet Take 500 mg by mouth daily with breakfast.    . Multiple Vitamin (MULTIVITAMIN) tablet Take 1 tablet by mouth daily.    . rosuvastatin (CRESTOR) 10 MG tablet Take 10 mg by mouth at bedtime.     No current facility-administered medications on file prior to visit.   Allergies  Allergen Reactions  . Metformin And Related Diarrhea    Review of Systems  Constitutional: Negative for chills and fever.  HENT: Positive for congestion, ear pain (right), postnasal drip, sinus pressure and sinus pain. Negative for dental problem, sore throat and trouble swallowing.   Respiratory: Positive for cough. Negative for shortness of breath and wheezing.   Cardiovascular: Negative for chest pain.   Gastrointestinal: Negative for abdominal pain, nausea and vomiting.  Skin: Negative for color change.  Neurological: Positive for headaches. Light-headedness: right side.       Objective:   Physical Exam Nursing note reviewed.  Constitutional:      General: She is not in acute distress.    Appearance: Normal appearance.  HENT:     Head: Normocephalic.  Musculoskeletal:     Cervical back: Neck supple.  Neurological:     Mental Status: She is alert.  Psychiatric:        Mood and Affect: Mood normal.    Exam limited due to virtual visit. Patient appears stable and answers questions appropriately.      Assessment & Plan:  54 y.o. female with c/o right side sinus pressure and pain, post nasal drainage with cough as result of drainage. Right ear pain. Will treat with Amoxil for possible sinus and ear infection and Tessalon for cough. She will use humidifier and Claritin. She will call as needed. Discussed with the patient and all questioned fully answered.

## 2020-07-16 NOTE — Patient Instructions (Signed)
Earache, Adult An earache, or ear pain, can be caused by many things, including:  An infection.  Ear wax buildup.  Ear pressure.  Something in the ear that should not be there (foreign body).  A sore throat.  Tooth problems.  Jaw problems. Treatment of the earache will depend on the cause. If the cause is not clear or cannot be determined, you may need to watch your symptoms until your earache goes away or until a cause is found. Follow these instructions at home: Medicines  Take or apply over-the-counter and prescription medicines only as told by your health care provider.  If you were prescribed an antibiotic medicine, use it as told by your health care provider. Do not stop using the antibiotic even if you start to feel better.  Do not put anything in your ear other than medicine that is prescribed by your health care provider. Managing pain If directed, apply heat to the affected area as often as told by your health care provider. Use the heat source that your health care provider recommends, such as a moist heat pack or a heating pad.  Place a towel between your skin and the heat source.  Leave the heat on for 20-30 minutes.  Remove the heat if your skin turns bright red. This is especially important if you are unable to feel pain, heat, or cold. You may have a greater risk of getting burned. If directed, put ice on the affected area as often as told by your health care provider. To do this:      Put ice in a plastic bag.  Place a towel between your skin and the bag.  Leave the ice on for 20 minutes, 2-3 times a day. General instructions  Pay attention to any changes in your symptoms.  Try resting in an upright position instead of lying down. This may help to reduce pressure in your ear and relieve pain.  Chew gum if it helps to relieve your ear pain.  Treat any allergies as told by your health care provider.  Drink enough fluid to keep your urine pale  yellow.  It is up to you to get the results of any tests that were done. Ask your health care provider, or the department that is doing the tests, when your results will be ready.  Keep all follow-up visits as told by your health care provider. This is important. Contact a health care provider if:  Your pain does not improve within 2 days.  Your earache gets worse.  You have new symptoms.  You have a fever. Get help right away if you:  Have a severe headache.  Have a stiff neck.  Have trouble swallowing.  Have redness or swelling behind your ear.  Have fluid or blood coming from your ear.  Have hearing loss.  Feel dizzy. Summary  An earache, or ear pain, can be caused by many things.  Treatment of the earache will depend on the cause. Follow recommendations from your health care provider to treat your ear pain.  If the cause is not clear or cannot be determined, you may need to watch your symptoms until your earache goes away or until a cause is found.  Keep all follow-up visits as told by your health care provider. This is important. This information is not intended to replace advice given to you by your health care provider. Make sure you discuss any questions you have with your health care provider. Document Revised: 03/12/2019   Document Reviewed: 03/12/2019 Elsevier Patient Education  2020 [employer]         Sinusitis, Adult Sinusitis is soreness and swelling (inflammation) of your sinuses. Sinuses are hollow spaces in the bones around your face. They are located:  Around your eyes.  In the middle of your forehead.  Behind your nose.  In your cheekbones. Your sinuses and nasal passages are lined with a fluid called mucus. Mucus drains out of your sinuses. Swelling can trap mucus in your sinuses. This lets germs (bacteria, virus, or fungus) grow, which leads to infection. Most of the time, this condition is caused by a virus. What are the  causes? This condition is caused by:  Allergies.  Asthma.  Germs.  Things that block your nose or sinuses.  Growths in the nose (nasal polyps).  Chemicals or irritants in the air.  Fungus (rare). What increases the risk? You are more likely to develop this condition if:  You have a weak body defense system (immune system).  You do a lot of swimming or diving.  You use nasal sprays too much.  You smoke. What are the signs or symptoms? The main symptoms of this condition are pain and a feeling of pressure around the sinuses. Other symptoms include:  Stuffy nose (congestion).  Runny nose (drainage).  Swelling and warmth in the sinuses.  Headache.  Toothache.  A cough that may get worse at night.  Mucus that collects in the throat or the back of the nose (postnasal drip).  Being unable to smell and taste.  Being very tired (fatigue).  A fever.  Sore throat.  Bad breath. How is this diagnosed? This condition is diagnosed based on:  Your symptoms.  Your medical history.  A physical exam.  Tests to find out if your condition is short-term (acute) or long-term (chronic). Your doctor may: ? Check your nose for growths (polyps). ? Check your sinuses using a tool that has a light (endoscope). ? Check for allergies or germs. ? Do imaging tests, such as an MRI or CT scan. How is this treated? Treatment for this condition depends on the cause and whether it is short-term or long-term.  If caused by a virus, your symptoms should go away on their own within 10 days. You may be given medicines to relieve symptoms. They include: ? Medicines that shrink swollen tissue in the nose. ? Medicines that treat allergies (antihistamines). ? A spray that treats swelling of the nostrils. ? Rinses that help get rid of thick mucus in your nose (nasal saline washes).  If caused by bacteria, your doctor may wait to see if you will get better without treatment. You may be  given antibiotic medicine if you have: ? A very bad infection. ? A weak body defense system.  If caused by growths in the nose, you may need to have surgery. Follow these instructions at home: Medicines  Take, use, or apply over-the-counter and prescription medicines only as told by your doctor. These may include nasal sprays.  If you were prescribed an antibiotic medicine, take it as told by your doctor. Do not stop taking the antibiotic even if you start to feel better. Hydrate and humidify   Drink enough water to keep your pee (urine) pale yellow.  Use a cool mist humidifier to keep the humidity level in your home above 50%.  Breathe in steam for 10-15 minutes, 3-4 times a day, or as told by your doctor. You can do this in   the bathroom while a hot shower is running.  Try not to spend time in cool or dry air. Rest  Rest as much as you can.  Sleep with your head raised (elevated).  Make sure you get enough sleep each night. General instructions   Put a warm, moist washcloth on your face 3-4 times a day, or as often as told by your doctor. This will help with discomfort.  Wash your hands often with soap and water. If there is no soap and water, use hand sanitizer.  Do not smoke. Avoid being around people who are smoking (secondhand smoke).  Keep all follow-up visits as told by your doctor. This is important. Contact a doctor if:  You have a fever.  Your symptoms get worse.  Your symptoms do not get better within 10 days. Get help right away if:  You have a very bad headache.  You cannot stop throwing up (vomiting).  You have very bad pain or swelling around your face or eyes.  You have trouble seeing.  You feel confused.  Your neck is stiff.  You have trouble breathing. Summary  Sinusitis is swelling of your sinuses. Sinuses are hollow spaces in the bones around your face.  This condition is caused by tissues in your nose that become inflamed or  swollen. This traps germs. These can lead to infection.  If you were prescribed an antibiotic medicine, take it as told by your doctor. Do not stop taking it even if you start to feel better.  Keep all follow-up visits as told by your doctor. This is important. This information is not intended to replace advice given to you by your health care provider. Make sure you discuss any questions you have with your health care provider. Document Revised: 01/04/2018 Document Reviewed: 01/04/2018 Elsevier Patient Education  2020 [employer]  

## 2020-08-02 ENCOUNTER — Ambulatory Visit: Payer: Managed Care, Other (non HMO)

## 2023-01-23 ENCOUNTER — Other Ambulatory Visit: Payer: Self-pay | Admitting: Internal Medicine

## 2023-01-23 DIAGNOSIS — Z1231 Encounter for screening mammogram for malignant neoplasm of breast: Secondary | ICD-10-CM

## 2023-02-10 ENCOUNTER — Ambulatory Visit
Admission: RE | Admit: 2023-02-10 | Discharge: 2023-02-10 | Disposition: A | Discharge status: Home / Self Care | Payer: Managed Care, Other (non HMO) | Source: Ambulatory Visit | Attending: Internal Medicine | Admitting: Internal Medicine

## 2023-02-10 DIAGNOSIS — Z1231 Encounter for screening mammogram for malignant neoplasm of breast: Secondary | ICD-10-CM | POA: Diagnosis present

## 2023-02-11 ENCOUNTER — Inpatient Hospital Stay
Admission: RE | Admit: 2023-02-11 | Discharge: 2023-02-11 | Disposition: A | Discharge status: Home / Self Care | Payer: Self-pay | Source: Ambulatory Visit | Attending: Internal Medicine | Admitting: Internal Medicine

## 2023-02-11 ENCOUNTER — Other Ambulatory Visit: Payer: Self-pay | Admitting: *Deleted

## 2023-02-11 DIAGNOSIS — Z1231 Encounter for screening mammogram for malignant neoplasm of breast: Secondary | ICD-10-CM
